# Patient Record
Sex: Female | Born: 1995 | Race: Black or African American | Hispanic: No | Marital: Single
Health system: Southern US, Community
[De-identification: ages and names within clinical notes are randomized; demographics above are authoritative.]

---

## 2019-02-20 ENCOUNTER — Emergency Department (HOSPITAL_COMMUNITY)
Admission: EM | Admit: 2019-02-20 | Discharge: 2019-02-21 | Disposition: A | Discharge status: Home / Self Care | Payer: Medicaid Other | Attending: Emergency Medicine | Admitting: Emergency Medicine

## 2019-02-20 ENCOUNTER — Other Ambulatory Visit: Payer: Self-pay

## 2019-02-20 ENCOUNTER — Encounter (HOSPITAL_COMMUNITY): Payer: Self-pay | Admitting: Emergency Medicine

## 2019-02-20 DIAGNOSIS — J029 Acute pharyngitis, unspecified: Secondary | ICD-10-CM | POA: Diagnosis present

## 2019-02-20 DIAGNOSIS — M6740 Ganglion, unspecified site: Secondary | ICD-10-CM | POA: Insufficient documentation

## 2019-02-20 LAB — GROUP A STREP BY PCR: Group A Strep by PCR: NOT DETECTED

## 2019-02-20 NOTE — ED Triage Notes (Signed)
Pt reports throat is swollen. Denies any other symptoms including covid symptoms.

## 2019-02-21 ENCOUNTER — Emergency Department (HOSPITAL_COMMUNITY): Payer: Medicaid Other

## 2019-02-21 MED ORDER — DEXAMETHASONE SODIUM PHOSPHATE 10 MG/ML IJ SOLN
10.0000 mg | Freq: Once | INTRAMUSCULAR | Status: AC
  Administered 2019-02-21: 10 mg via INTRAMUSCULAR
  Filled 2019-02-21: qty 1

## 2019-02-21 NOTE — ED Provider Notes (Signed)
[facility] EMERGENCY DEPARTMENT Provider Note   CSN: [id number] Arrival date & time: 02/20/19  2139     History Chief Complaint  Patient presents with  . Sore Throat    Brenda Herrera is a 24 y.o. female who presents to the emergency department with a chief complaint of sore throat.  The patient reports that she has been feeling some "fullness" in her throat over the last few days. She reports an associated cough, but feels that this is secondary to her throat feeling dry.  She denies trismus, drooling, facial or neck swelling, fever, chills, dysphagia, shortness of breath, wheezing, stridor, abdominal pain, nausea, vomiting, or diarrhea.  She reports that she used her cell phone to take a picture of the back of her throat and noticed a little "patch" at the back of the throat that she is concerned should not be there.  She is unsure if she was fully emanated as a child.  No known or suspected COVID-19 contacts.  She has been able to eat and drink without difficulty.  She does note that she had a lymph node biopsied at [facility] several years ago.  She states this was secondary to developing bruising on her legs that is since resolved.  States that she has not had any problems or followed up with anyone regarding the previous biopsy in several years.  The history is provided by the patient. No language interpreter was used.       History reviewed. No pertinent past medical history.  There are no problems to display for this patient.   History reviewed. No pertinent surgical history.   OB History   No obstetric history on file.     No family history on file.  Social History   Tobacco Use  . Smoking status: Not on file  Substance Use Topics  . Alcohol use: Not on file  . Drug use: Not on file    Home Medications Prior to Admission medications   Not on File    Allergies    Patient has no known allergies.  Review of Systems   Review of Systems    Constitutional: Negative for activity change, chills, diaphoresis and fever.  HENT: Positive for sore throat. Negative for drooling, ear discharge, ear pain, hearing loss, mouth sores, nosebleeds, sinus pressure, sinus pain and trouble swallowing.   Eyes: Negative for redness and visual disturbance.  Respiratory: Positive for cough. Negative for shortness of breath and wheezing.   Cardiovascular: Negative for chest pain, palpitations and leg swelling.  Gastrointestinal: Negative for abdominal pain, nausea and vomiting.  Genitourinary: Negative for dysuria.  Musculoskeletal: Negative for back pain.  Skin: Negative for rash.  Allergic/Immunologic: Negative for immunocompromised state.  Neurological: Negative for dizziness, seizures, syncope, weakness, numbness and headaches.  Psychiatric/Behavioral: Negative for confusion.    Physical Exam Updated Vital Signs BP 124/85   Pulse 73   Temp 98.5 F (36.9 C) (Oral)   Resp 15   Ht 5\' 4"  (1.626 m)   Wt 84.8 kg   SpO2 100%   BMI 32.10 kg/m   Physical Exam Vitals and nursing note reviewed.  Constitutional:      General: She is not in acute distress. HENT:     Head: Normocephalic.     Comments: Sublingual space is soft and nontender.  No swelling.    Right Ear: Hearing, tympanic membrane and ear canal normal.     Left Ear: Hearing, tympanic membrane and ear canal normal.  Nose: Nose normal.     Right Sinus: No maxillary sinus tenderness or frontal sinus tenderness.     Left Sinus: No maxillary sinus tenderness or frontal sinus tenderness.     Mouth/Throat:     Mouth: Mucous membranes are moist. No injury, lacerations, oral lesions or angioedema.     Dentition: No dental tenderness.     Tongue: Tongue does not deviate from midline.     Palate: No mass and lesions.     Pharynx: Oropharynx is clear. Uvula midline. Posterior oropharyngeal erythema present. No pharyngeal swelling, oropharyngeal exudate or uvula swelling.     Tonsils:  No tonsillar exudate or tonsillar abscesses. 0 on the right. 0 on the left.     Comments: The tip of the epiglottis is able to be visualized in the posterior oropharynx.  No swelling or edema.  No trismus.  No cervical lymphadenopathy.  No meningismus.  Patient is tolerating secretions without difficulty.  Eyes:     Conjunctiva/sclera: Conjunctivae normal.  Neck:     Comments: Well-healed surgical scar noted to the left anterolateral neck. Cardiovascular:     Rate and Rhythm: Normal rate and regular rhythm.     Pulses: Normal pulses.     Heart sounds: Normal heart sounds. No murmur. No friction rub. No gallop.   Pulmonary:     Effort: Pulmonary effort is normal. No respiratory distress.     Breath sounds: No stridor. No wheezing, rhonchi or rales.     Comments:  Able to speak in complete, fluent sentences without increased work of breathing.  No stridor.  No adventitious lung sounds. Chest:     Chest wall: No tenderness.  Abdominal:     General: There is no distension.     Palpations: Abdomen is soft. There is no mass.     Tenderness: There is no abdominal tenderness. There is no right CVA tenderness, left CVA tenderness, guarding or rebound.     Hernia: No hernia is present.  Musculoskeletal:     Cervical back: Neck supple.     Comments: Rubbery, well-circumscribed nodule noted to the dorsum of the left hand along the fifth tendon sheath.   Skin:    General: Skin is warm.     Findings: No rash.  Neurological:     Mental Status: She is alert.  Psychiatric:        Behavior: Behavior normal.     ED Results / Procedures / Treatments   Labs (all labs ordered are listed, but only abnormal results are displayed) Labs Reviewed  GROUP A STREP BY PCR    EKG None  Radiology DG Neck Soft Tissue  Result Date: 02/21/2019 CLINICAL DATA:  Throat swelling EXAM: NECK SOFT TISSUES - 1+ VIEW COMPARISON:  None. FINDINGS: No prevertebral soft tissue swelling is noted. The epiglottis and  aryepiglottic folds are within normal limits. No radiopaque foreign body is seen. No other focal abnormality is noted. IMPRESSION: No acute abnormality noted. Electronically Signed   By: Inez Catalina M.D.   On: 02/21/2019 01:50    Procedures Procedures (including critical care time)  Medications Ordered in ED Medications  dexamethasone (DECADRON) injection 10 mg (10 mg Intramuscular Given 02/21/19 0224)    ED Course  I have reviewed the triage vital signs and the nursing notes.  Pertinent labs & imaging results that were available during my care of the patient were reviewed by me and considered in my medical decision making (see chart for details).  MDM Rules/Calculators/A&P                      24 year old female presenting with a sore throat over the last few days.  No constitutional symptoms.  Vital signs are normal in the ER.  On exam, there is no trismus, drooling.  Uvula is midline.  There is some erythema noted to the posterior oropharynx.  The tip of the epiglottis is also visualized in the posterior oropharynx.  No edema.  No evidence of retropharyngeal abscess or peritonsillar abscess.  She has no cervical lymphadenopathy and there is a well-healed scar to the left cervical anterior lymph nodes from what the patient describes as of previous lymph node biopsy that occurred several years ago.  Strep PCR is negative.  Patient is concerned that when she went to evaluate her sore throat and used her cell phone that she was able to see her epiglottis in the posterior oropharynx.  Obtained a soft tissue x-ray of the neck, which revealed that her epiglottis was normal.  The patient is unsure if she was fully immunized as a child and immunization records are not available in epic.  She does also note that she had a lymph node biopsy at [facility] several years ago after she developed unusual bruising.  Unfortunately, these records are not available at this time.  She has had no bruising or bleeding  symptoms recently.  She also has not had to follow-up for this in several years.  Today, I have a low suspicion for PTA, retropharyngeal abscess, epiglottitis, Ludwick's angina, or periapical abscess.  She is tolerating her secretions without difficulty in the ER.  COVID-19 outpatient test has been ordered and is pending.  I have also given the patient a dose of Decadron for complaint of sore throat.  She will also be given a referral to ENT if symptoms do not significantly improve.  ER return precautions given.  She is hemodynamically stable and in no acute distress.  Safe for discharge to home with outpatient follow-up as indicated.  Final Clinical Impression(s) / ED Diagnoses Final diagnoses:  Sore throat  Ganglion cyst    Rx / DC Orders ED Discharge Orders    None       Ayssa Bentivegna A, PA-C 02/21/19 0824    Ward, Layla Maw, DO 02/22/19 0131

## 2019-02-21 NOTE — Discharge Instructions (Addendum)
Thank you for allowing me to care for you today in the Emergency Department.   You can call to schedule a follow-up appointment with ear nose and throat if your symptoms persist.  Dr. Allene Pyo information is listed above.  You were given a shot of Decadron today to help with your symptoms.  For the ganglion cyst on your left hand, you can follow-up with neurosurgery if it becomes bothersome.  Return to the emergency department if you become unable to swallow your saliva, if you develop drooling, severe swelling to one side of your neck, respiratory distress, or other new, concerning symptoms.

## 2021-07-21 IMAGING — DX DG NECK SOFT TISSUE
2 series · 2 of 2 positions shown · non-contrast
Comparison: None.

CLINICAL DATA: Throat swelling

EXAM:
NECK SOFT TISSUES - 1+ VIEW

[neck lat]
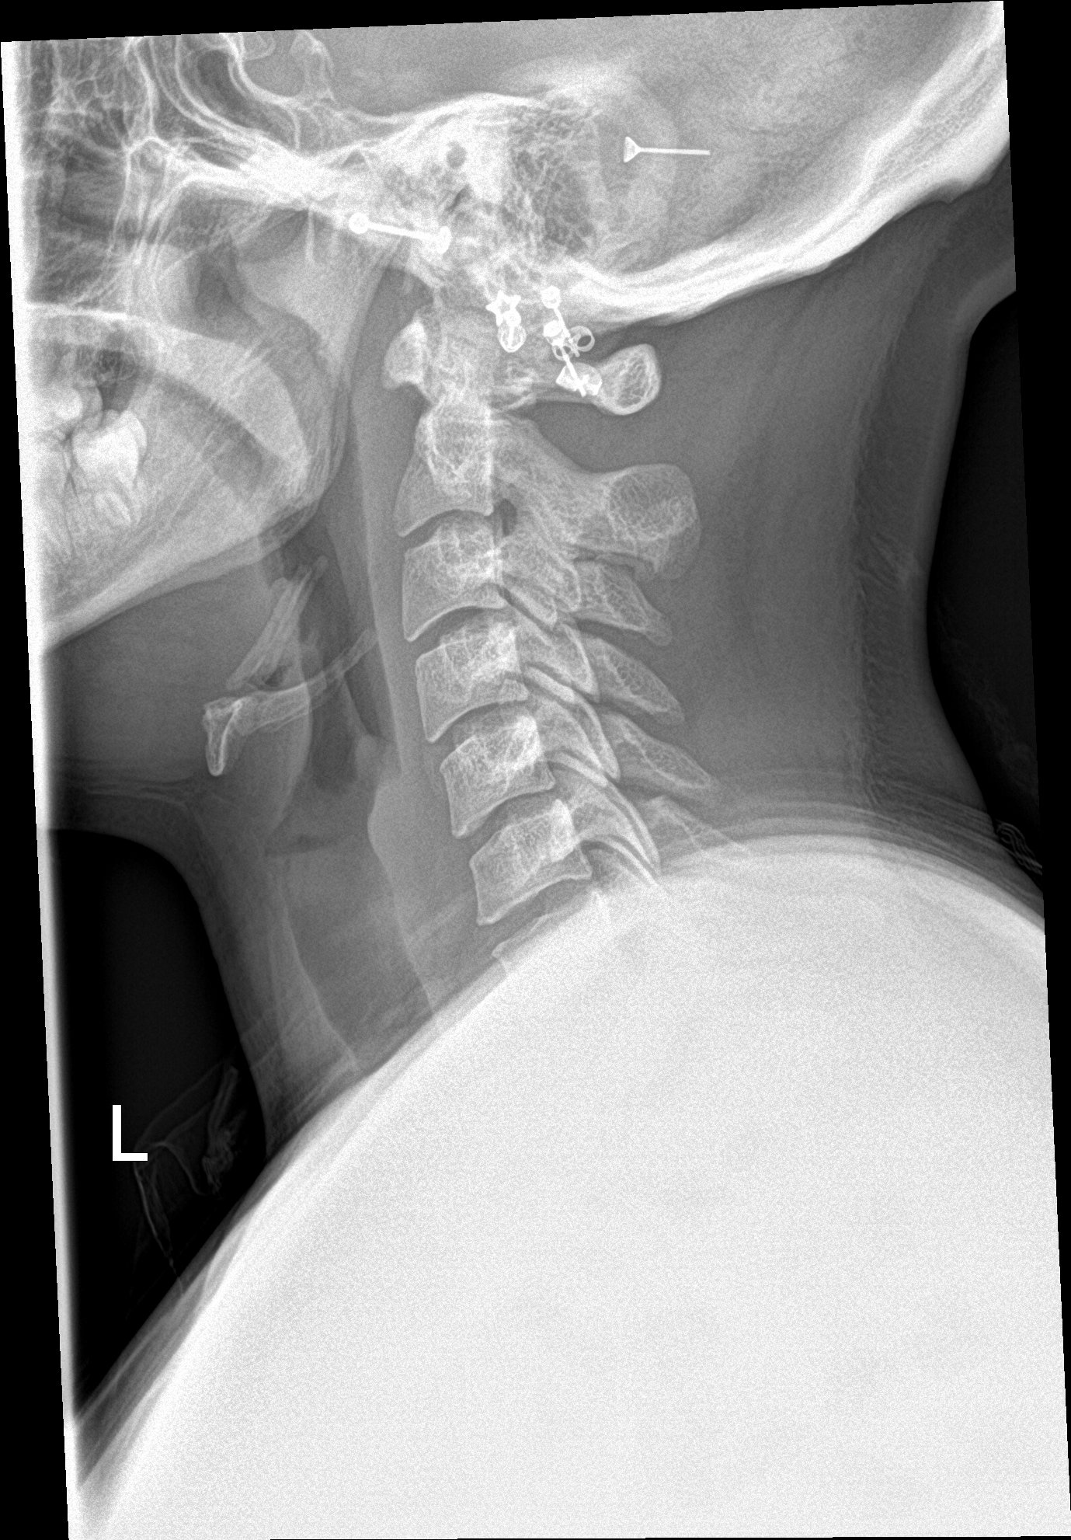

[neck ap]
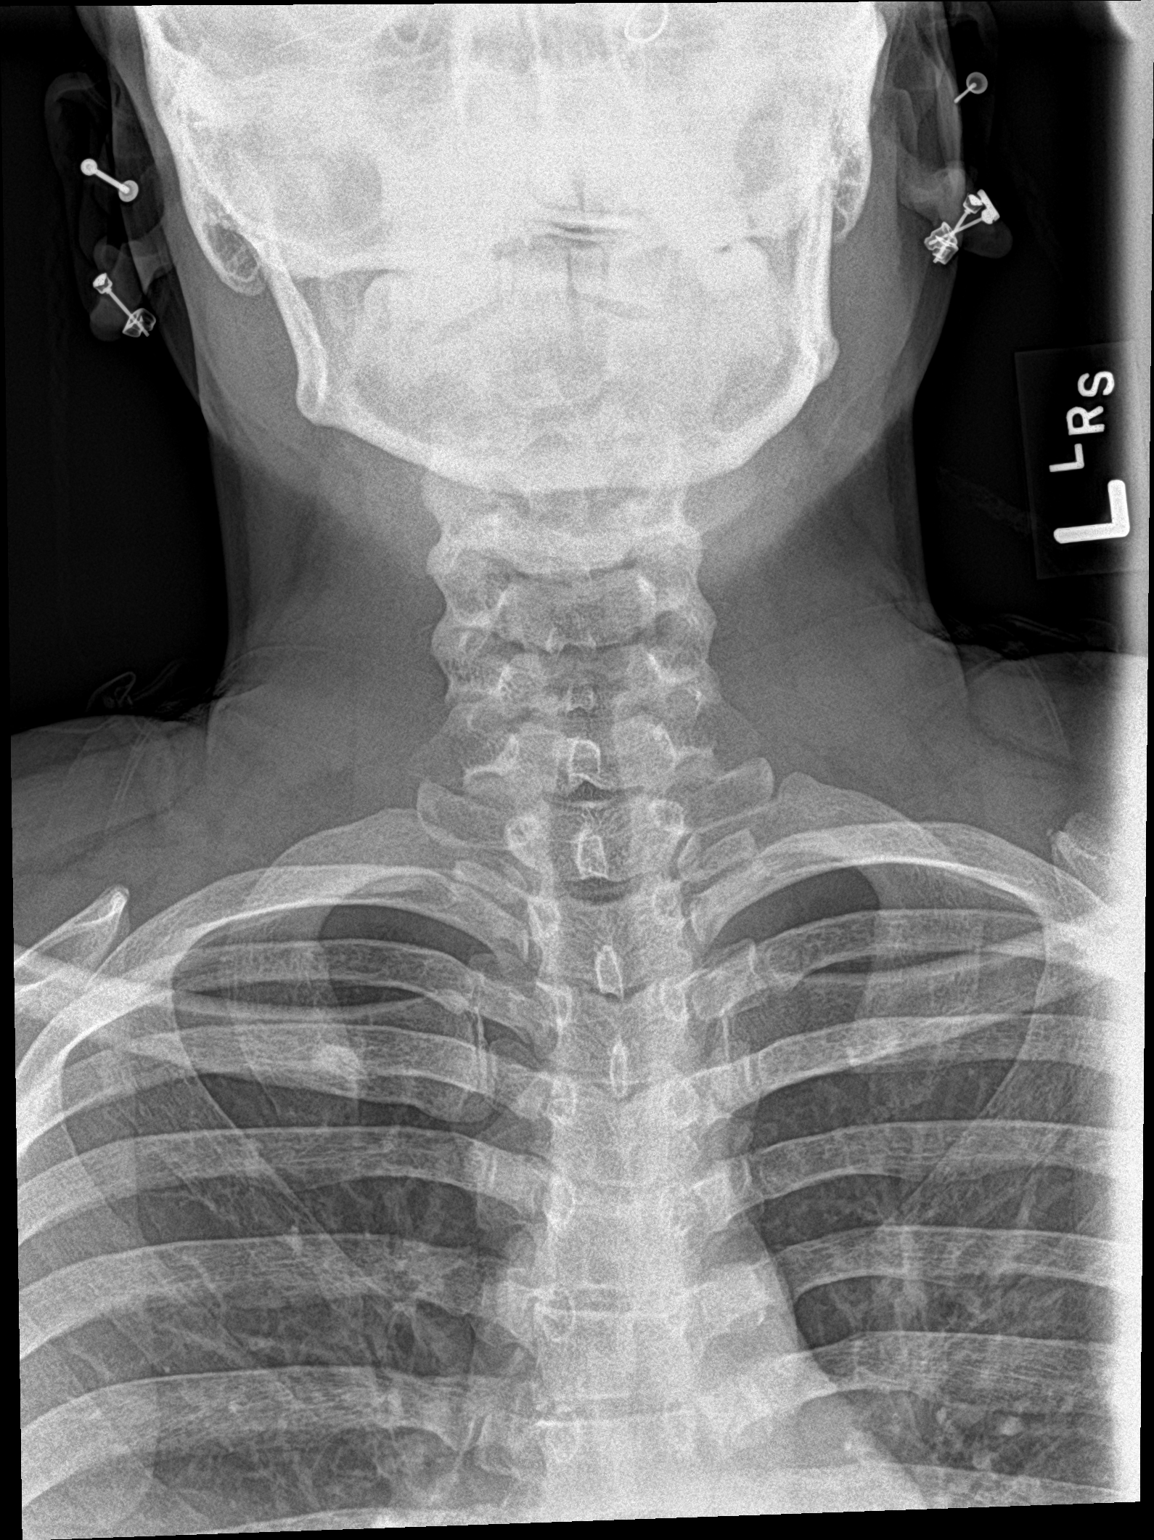

[2 of 2 positions shown; findings below may reference images not displayed]

FINDINGS: No prevertebral soft tissue swelling is noted. The epiglottis and
aryepiglottic folds are within normal limits. No radiopaque foreign
body is seen. No other focal abnormality is noted.
IMPRESSION: No acute abnormality noted.
